# Patient Record
Sex: Female | Born: 1990 | Race: White | Hispanic: No | Marital: Married | Smoking: Former smoker
Health system: Southern US, Community
[De-identification: ages and names within clinical notes are randomized; demographics above are authoritative.]

## PROBLEM LIST (undated history)

## (undated) DIAGNOSIS — J45909 Unspecified asthma, uncomplicated: Secondary | ICD-10-CM

## (undated) DIAGNOSIS — Z3490 Encounter for supervision of normal pregnancy, unspecified, unspecified trimester: Secondary | ICD-10-CM

## (undated) HISTORY — PX: FACIAL RECONSTRUCTION SURGERY: SHX631

---

## 2014-07-02 ENCOUNTER — Emergency Department (HOSPITAL_COMMUNITY)
Admission: EM | Admit: 2014-07-02 | Discharge: 2014-07-02 | Disposition: A | Discharge status: Home / Self Care | Payer: Self-pay | Attending: Emergency Medicine | Admitting: Emergency Medicine

## 2014-07-02 ENCOUNTER — Encounter (HOSPITAL_COMMUNITY): Payer: Self-pay | Admitting: Emergency Medicine

## 2014-07-02 ENCOUNTER — Emergency Department (HOSPITAL_COMMUNITY): Payer: Self-pay

## 2014-07-02 DIAGNOSIS — Z3490 Encounter for supervision of normal pregnancy, unspecified, unspecified trimester: Secondary | ICD-10-CM

## 2014-07-02 DIAGNOSIS — Z880 Allergy status to penicillin: Secondary | ICD-10-CM | POA: Insufficient documentation

## 2014-07-02 DIAGNOSIS — R1013 Epigastric pain: Secondary | ICD-10-CM | POA: Insufficient documentation

## 2014-07-02 DIAGNOSIS — R079 Chest pain, unspecified: Secondary | ICD-10-CM

## 2014-07-02 DIAGNOSIS — Z79899 Other long term (current) drug therapy: Secondary | ICD-10-CM | POA: Insufficient documentation

## 2014-07-02 DIAGNOSIS — O99512 Diseases of the respiratory system complicating pregnancy, second trimester: Secondary | ICD-10-CM | POA: Insufficient documentation

## 2014-07-02 DIAGNOSIS — Z3A20 20 weeks gestation of pregnancy: Secondary | ICD-10-CM | POA: Insufficient documentation

## 2014-07-02 DIAGNOSIS — R109 Unspecified abdominal pain: Secondary | ICD-10-CM

## 2014-07-02 DIAGNOSIS — J45909 Unspecified asthma, uncomplicated: Secondary | ICD-10-CM | POA: Insufficient documentation

## 2014-07-02 DIAGNOSIS — O9989 Other specified diseases and conditions complicating pregnancy, childbirth and the puerperium: Secondary | ICD-10-CM | POA: Insufficient documentation

## 2014-07-02 HISTORY — DX: Unspecified asthma, uncomplicated: J45.909

## 2014-07-02 LAB — COMPREHENSIVE METABOLIC PANEL
ALBUMIN: 3.2 g/dL — AB (ref 3.5–5.0)
ALK PHOS: 46 U/L (ref 38–126)
ALT: 12 U/L — ABNORMAL LOW (ref 14–54)
AST: 17 U/L (ref 15–41)
Anion gap: 8 (ref 5–15)
BILIRUBIN TOTAL: 0.3 mg/dL (ref 0.3–1.2)
BUN: 6 mg/dL (ref 6–20)
CO2: 20 mmol/L — ABNORMAL LOW (ref 22–32)
Calcium: 8.8 mg/dL — ABNORMAL LOW (ref 8.9–10.3)
Chloride: 108 mmol/L (ref 101–111)
Creatinine, Ser: 0.43 mg/dL — ABNORMAL LOW (ref 0.44–1.00)
GFR calc Af Amer: >60 mL/min (ref >60)
GFR calc non Af Amer: >60 mL/min (ref >60)
Glucose, Bld: 91 mg/dL (ref 65–99)
Potassium: 3.4 mmol/L — ABNORMAL LOW (ref 3.5–5.1)
Sodium: 136 mmol/L (ref 135–145)
TOTAL PROTEIN: 7.2 g/dL (ref 6.5–8.1)

## 2014-07-02 LAB — CBC WITH DIFFERENTIAL/PLATELET
BASOS ABS: 0.1 10*3/uL (ref 0.0–0.1)
Basophils Relative: 0 % (ref 0–1)
Eosinophils Absolute: 0.5 10*3/uL (ref 0.0–0.7)
Eosinophils Relative: 4 % (ref 0–5)
HEMATOCRIT: 31.2 % — AB (ref 36.0–46.0)
HEMOGLOBIN: 10.3 g/dL — AB (ref 12.0–15.0)
LYMPHS ABS: 2.8 10*3/uL (ref 0.7–4.0)
LYMPHS PCT: 22 % (ref 12–46)
MCH: 27.5 pg (ref 26.0–34.0)
MCHC: 33 g/dL (ref 30.0–36.0)
MCV: 83.4 fL (ref 78.0–100.0)
MONO ABS: 0.7 10*3/uL (ref 0.1–1.0)
Monocytes Relative: 6 % (ref 3–12)
NEUTROS PCT: 68 % (ref 43–77)
Neutro Abs: 8.4 10*3/uL — ABNORMAL HIGH (ref 1.7–7.7)
Platelets: 401 10*3/uL — ABNORMAL HIGH (ref 150–400)
RBC: 3.74 MIL/uL — ABNORMAL LOW (ref 3.87–5.11)
RDW: 13.8 % (ref 11.5–15.5)
WBC: 12.5 10*3/uL — AB (ref 4.0–10.5)

## 2014-07-02 LAB — URINE MICROSCOPIC-ADD ON

## 2014-07-02 LAB — URINALYSIS, ROUTINE W REFLEX MICROSCOPIC
BILIRUBIN URINE: NEGATIVE
GLUCOSE, UA: NEGATIVE mg/dL
Hgb urine dipstick: NEGATIVE
Ketones, ur: NEGATIVE mg/dL
NITRITE: NEGATIVE
PH: 6 (ref 5.0–8.0)
Protein, ur: NEGATIVE mg/dL
Specific Gravity, Urine: 1.025 (ref 1.005–1.030)
Urobilinogen, UA: 0.2 mg/dL (ref 0.0–1.0)

## 2014-07-02 LAB — LIPASE, BLOOD: Lipase: 26 U/L (ref 22–51)

## 2014-07-02 MED ORDER — ONDANSETRON HCL 4 MG/2ML IJ SOLN
4.0000 mg | Freq: Once | INTRAMUSCULAR | Status: AC
  Administered 2014-07-02: 4 mg via INTRAVENOUS
  Filled 2014-07-02: qty 2

## 2014-07-02 MED ORDER — SODIUM CHLORIDE 0.9 % IV SOLN
1000.0000 mL | Freq: Once | INTRAVENOUS | Status: AC
Start: 2014-07-02 — End: 2014-07-02
  Administered 2014-07-02: 1000 mL via INTRAVENOUS

## 2014-07-02 MED ORDER — SODIUM CHLORIDE 0.9 % IV SOLN
1000.0000 mL | INTRAVENOUS | Status: DC
Start: 2014-07-02 — End: 2014-07-03
  Administered 2014-07-02: 2000 mL via INTRAVENOUS

## 2014-07-02 MED ORDER — FAMOTIDINE 20 MG PO TABS: 20.0000 mg | ORAL_TABLET | Freq: Two times a day (BID) | ORAL | Status: AC

## 2014-07-02 MED ORDER — ALUM & MAG HYDROXIDE-SIMETH 200-200-20 MG/5ML PO SUSP
30.0000 mL | Freq: Once | ORAL | Status: AC
  Administered 2014-07-02: 30 mL via ORAL
  Filled 2014-07-02: qty 30

## 2014-07-02 NOTE — ED Notes (Signed)
Pt alert & oriented x4, stable gait. Patient given discharge instructions, paperwork & prescription(s). Patient  instructed to stop at the registration desk to finish any additional paperwork. Patient verbalized understanding. Pt left department w/ no further questions. 

## 2014-07-02 NOTE — ED Provider Notes (Signed)
CSN: [id number]     Arrival date & time 07/02/14  1951 History   First MD Initiated Contact with Patient 07/02/14 1958     Chief Complaint  Patient presents with  . Abdominal Pain   HPI Patient is 20 weeks estimated gestational age. Since this evening about an hour or so ago she had sudden onset of pain in her upper abdomen that moved up towards her chest. The pressure type sensation and had some dyspnea. She denies any trouble with nausea or vomiting. No dysuria. No diarrhea or constipation. She had eaten something about 2-3 hours before. She did not have any pain or discomfort with that. Patient denies any previous history of similar symptoms. Past Medical History  Diagnosis Date  . Asthma    Past Surgical History  Procedure Laterality Date  . Facial reconstruction surgery     No family history on file. History  Substance Use Topics  . Smoking status: Never Smoker   . Smokeless tobacco: Not on file  . Alcohol Use: No   OB History    Gravida Para Term Preterm AB TAB SAB Ectopic Multiple Living   1              Review of Systems  All other systems reviewed and are negative.     Allergies  Amoxicillin and Penicillins  Home Medications   Prior to Admission medications   Medication Sig Start Date End Date Taking? Authorizing Provider  famotidine (PEPCID) 20 MG tablet Take 1 tablet (20 mg total) by mouth 2 (two) times daily. 07/02/14   Linwood DibblesJon Liya Strollo, MD  ibuprofen (ADVIL,MOTRIN) 200 MG tablet Take 800 mg by mouth every 6 (six) hours as needed for mild pain or moderate pain.   Yes Historical Provider, MD  nitrofurantoin, macrocrystal-monohydrate, (MACROBID) 100 MG capsule Take 100 mg by mouth 2 (two) times daily. 06/20/14   Historical Provider, MD   BP 109/64 mmHg  Pulse 86  Temp(Src) 97.3 F (36.3 C) (Oral)  Resp 20  SpO2 99%  LMP  Physical Exam  Constitutional: She appears well-developed and well-nourished. No distress.  HENT:  Head: Normocephalic and atraumatic.  Right  Ear: External ear normal.  Left Ear: External ear normal.  Eyes: Conjunctivae are normal. Right eye exhibits no discharge. Left eye exhibits no discharge. No scleral icterus.  Neck: Neck supple. No tracheal deviation present.  Cardiovascular: Normal rate, regular rhythm and intact distal pulses.   Pulmonary/Chest: Effort normal and breath sounds normal. No stridor. No respiratory distress. She has no wheezes. She has no rales.  Abdominal: Soft. Bowel sounds are normal. She exhibits no distension. There is tenderness in the epigastric area. There is no rebound and no guarding. No hernia.  Musculoskeletal: She exhibits no edema or tenderness.  Neurological: She is alert. She has normal strength. No cranial nerve deficit (no facial droop, extraocular movements intact, no slurred speech) or sensory deficit. She exhibits normal muscle tone. She displays no seizure activity. Coordination normal.  Skin: Skin is warm and dry. No rash noted.  Psychiatric: She has a normal mood and affect.  Nursing note and vitals reviewed.   ED Course  Procedures (including critical care time) Labs Review Labs Reviewed  CBC WITH DIFFERENTIAL/PLATELET - Abnormal; Notable for the following:    WBC 12.5 (*)    RBC 3.74 (*)    Hemoglobin 10.3 (*)    HCT 31.2 (*)    Platelets 401 (*)    Neutro Abs 8.4 (*)  All other components within normal limits  COMPREHENSIVE METABOLIC PANEL - Abnormal; Notable for the following:    Potassium 3.4 (*)    CO2 20 (*)    Creatinine, Ser 0.43 (*)    Calcium 8.8 (*)    Albumin 3.2 (*)    ALT 12 (*)    All other components within normal limits  URINALYSIS, ROUTINE W REFLEX MICROSCOPIC - Abnormal; Notable for the following:    APPearance HAZY (*)    Leukocytes, UA SMALL (*)    All other components within normal limits  URINE MICROSCOPIC-ADD ON - Abnormal; Notable for the following:    Squamous Epithelial / LPF MANY (*)    Bacteria, UA MANY (*)    All other components within  normal limits  LIPASE, BLOOD    Imaging Review Dg Chest 1 View  07/02/2014   CLINICAL DATA:  Acute onset of lower chest pain and upper abdominal pain. Initial encounter.  EXAM: CHEST  1 VIEW  COMPARISON:  None.  FINDINGS: The lungs are well-aerated and clear. There is no evidence of focal opacification, pleural effusion or pneumothorax.  The cardiomediastinal silhouette is within normal limits. No acute osseous abnormalities are seen.  IMPRESSION: No acute cardiopulmonary process seen.   Electronically Signed   By: Roanna RaiderJeffery  Chang M.D.   On: 07/02/2014 22:08     EKG Interpretation   Date/Time:  Sunday Jul 02 2014 20:19:50 EDT Ventricular Rate:  84 PR Interval:  131 QRS Duration: 104 QT Interval:  371 QTC Calculation: 438 R Axis:   60 Text Interpretation:  Sinus rhythm No old tracing to compare Confirmed by  Zakaiya Lares  MD-J, Vadis Slabach ([id number]) on 07/02/2014 10:17:49 PM      MDM   Final diagnoses:  Pregnancy  Abdominal pain, unspecified abdominal location    Spector symptoms may have been related to esophagitis. The symptoms have improved a GI cocktail. Since symptoms have resolved. She is not tachycardic she is not dyspneic. I doubt pulmonary embolism.  Patient is not having any complaints of vaginal bleeding or normal fetal heart tones were obtained during her stay.  At this time there does not appear to be any evidence of an acute emergency medical condition and the patient appears stable for discharge with appropriate outpatient follow up.     Linwood DibblesJon Marcelline Temkin, MD 07/02/14 [id number]

## 2014-07-02 NOTE — Discharge Instructions (Signed)

## 2014-07-02 NOTE — Progress Notes (Signed)
Call with pt presenting at 20weeks with abd pain. [place]. RN reposrt doppler FHR 140 and on monitor at 135-140. Recommend to leave toco on and doppler FHR is sufficient.

## 2014-07-02 NOTE — ED Notes (Signed)
Pt. Reports she is [redacted] weeks pregnant pt. Reports abdominal pain starting just prior to arrival. Pt. Denies nausea/vomiting.

## 2014-09-30 ENCOUNTER — Encounter (HOSPITAL_COMMUNITY): Payer: Self-pay

## 2014-09-30 ENCOUNTER — Emergency Department (HOSPITAL_COMMUNITY)
Admission: EM | Admit: 2014-09-30 | Discharge: 2014-09-30 | Disposition: A | Discharge status: Home / Self Care | Payer: BLUE CROSS/BLUE SHIELD | Attending: Emergency Medicine | Admitting: Emergency Medicine

## 2014-09-30 DIAGNOSIS — R1011 Right upper quadrant pain: Secondary | ICD-10-CM | POA: Diagnosis not present

## 2014-09-30 DIAGNOSIS — Z3A32 32 weeks gestation of pregnancy: Secondary | ICD-10-CM | POA: Diagnosis not present

## 2014-09-30 DIAGNOSIS — Z3493 Encounter for supervision of normal pregnancy, unspecified, third trimester: Secondary | ICD-10-CM

## 2014-09-30 DIAGNOSIS — O9989 Other specified diseases and conditions complicating pregnancy, childbirth and the puerperium: Secondary | ICD-10-CM | POA: Diagnosis not present

## 2014-09-30 DIAGNOSIS — M549 Dorsalgia, unspecified: Secondary | ICD-10-CM | POA: Diagnosis not present

## 2014-09-30 DIAGNOSIS — R140 Abdominal distension (gaseous): Secondary | ICD-10-CM | POA: Diagnosis not present

## 2014-09-30 DIAGNOSIS — Z880 Allergy status to penicillin: Secondary | ICD-10-CM | POA: Diagnosis not present

## 2014-09-30 DIAGNOSIS — R1033 Periumbilical pain: Secondary | ICD-10-CM | POA: Diagnosis not present

## 2014-09-30 DIAGNOSIS — O99513 Diseases of the respiratory system complicating pregnancy, third trimester: Secondary | ICD-10-CM | POA: Diagnosis not present

## 2014-09-30 DIAGNOSIS — J45909 Unspecified asthma, uncomplicated: Secondary | ICD-10-CM | POA: Insufficient documentation

## 2014-09-30 DIAGNOSIS — Z79899 Other long term (current) drug therapy: Secondary | ICD-10-CM | POA: Diagnosis not present

## 2014-09-30 HISTORY — DX: Encounter for supervision of normal pregnancy, unspecified, unspecified trimester: Z34.90

## 2014-09-30 LAB — URINALYSIS, ROUTINE W REFLEX MICROSCOPIC
BILIRUBIN URINE: NEGATIVE
Bilirubin Urine: NEGATIVE
GLUCOSE, UA: NEGATIVE mg/dL
Glucose, UA: NEGATIVE mg/dL
HGB URINE DIPSTICK: NEGATIVE
Hgb urine dipstick: NEGATIVE
KETONES UR: NEGATIVE mg/dL
Ketones, ur: NEGATIVE mg/dL
LEUKOCYTES UA: NEGATIVE
NITRITE: NEGATIVE
Nitrite: NEGATIVE
PH: 6.5 (ref 5.0–8.0)
PROTEIN: NEGATIVE mg/dL
Protein, ur: NEGATIVE mg/dL
SPECIFIC GRAVITY, URINE: 1.01 (ref 1.005–1.030)
Urobilinogen, UA: 0.2 mg/dL (ref 0.0–1.0)
Urobilinogen, UA: 0.2 mg/dL (ref 0.0–1.0)
pH: 7 (ref 5.0–8.0)

## 2014-09-30 LAB — URINE MICROSCOPIC-ADD ON

## 2014-09-30 MED ORDER — SODIUM CHLORIDE 0.9 % IV BOLUS (SEPSIS)
1000.0000 mL | Freq: Once | INTRAVENOUS | Status: AC
Start: 2014-09-30 — End: 2014-09-30
  Administered 2014-09-30: 1000 mL via INTRAVENOUS

## 2014-09-30 MED ORDER — ACETAMINOPHEN 325 MG PO TABS: 650.0000 mg | ORAL_TABLET | Freq: Four times a day (QID) | ORAL | Status: AC | PRN

## 2014-09-30 MED ORDER — ACETAMINOPHEN 325 MG PO TABS
650.0000 mg | ORAL_TABLET | Freq: Once | ORAL | Status: AC
  Administered 2014-09-30: 650 mg via ORAL
  Filled 2014-09-30: qty 2

## 2014-09-30 NOTE — ED Notes (Signed)
Pt denies any discharge, vomiting, or diarrhea.  States that she has had this pain before and was told by OBGYN that she was having stretching pains from the growth of the baby.  This pain started Thursday and is not getting better

## 2014-09-30 NOTE — ED Notes (Addendum)
[facility] called, no contractions noted, baby does not appear to be in distress

## 2014-09-30 NOTE — Progress Notes (Addendum)
Spoke with Statistician. Dr. Despina Hidden wants the pt to get 1GM of IM Roecephin in addition to the oral antibiotics she is to be d/c with. If the ED MD has any questions, he can call Dr. Despina Hidden at [phone].

## 2014-09-30 NOTE — Progress Notes (Signed)
Updated [facility] RN on pt status; intermitent tracing 135bpm, uterine irritability w/occ UC

## 2014-09-30 NOTE — ED Provider Notes (Signed)
CSN: [id number]     Arrival date & time 09/30/14  4098 History   First MD Initiated Contact with Patient 09/30/14 0557     Chief Complaint  Patient presents with  . Abdominal Pain     (Consider location/radiation/quality/duration/timing/severity/associated sxs/prior Treatment) Patient is a 24 y.o. female presenting with abdominal pain. The history is provided by the patient.  Abdominal Pain Pain location:  Periumbilical Pain quality: aching and dull   Pain radiates to:  Does not radiate Pain severity:  Moderate Onset quality:  Gradual Duration:  36 hours Timing:  Constant Progression:  Unchanged Chronicity:  New Context: not recent sexual activity and not trauma   Relieved by:  None tried Ineffective treatments:  None tried Associated symptoms: nausea   Associated symptoms: no constipation, no diarrhea, no dysuria, no fever, no hematuria, no melena, no vaginal bleeding, no vaginal discharge and no vomiting     Past Medical History  Diagnosis Date  . Asthma   . Pregnant    Past Surgical History  Procedure Laterality Date  . Facial reconstruction surgery     No family history on file. Social History  Substance Use Topics  . Smoking status: Never Smoker   . Smokeless tobacco: None  . Alcohol Use: No   OB History    Gravida Para Term Preterm AB TAB SAB Ectopic Multiple Living   1              Review of Systems  Constitutional: Negative for fever.  Gastrointestinal: Positive for nausea and abdominal pain. Negative for vomiting, diarrhea, constipation and melena.  Genitourinary: Negative for dysuria, hematuria, vaginal bleeding and vaginal discharge.      Allergies  Amoxicillin and Penicillins  Home Medications   Prior to Admission medications   Medication Sig Start Date End Date Taking? Authorizing Provider  famotidine (PEPCID) 20 MG tablet Take 1 tablet (20 mg total) by mouth 2 (two) times daily. 07/02/14   Linwood Dibbles, MD  ibuprofen (ADVIL,MOTRIN) 200 MG  tablet Take 800 mg by mouth every 6 (six) hours as needed for mild pain or moderate pain.    Historical Provider, MD  nitrofurantoin, macrocrystal-monohydrate, (MACROBID) 100 MG capsule Take 100 mg by mouth 2 (two) times daily. 06/20/14   Historical Provider, MD   BP 120/64 mmHg  Pulse 85  Temp(Src) 97.9 F (36.6 C) (Oral)  Resp 18  Ht 5\' 4"  (1.626 m)  Wt 232 lb (105.235 kg)  BMI 39.80 kg/m2  SpO2 100%  LMP 01/11/2014 Physical Exam  Constitutional: She is oriented to person, place, and time. She appears well-developed and well-nourished.  HENT:  Head: Normocephalic and atraumatic.  Eyes: EOM are normal. Pupils are equal, round, and reactive to light.  Neck: Neck supple.  Cardiovascular: Normal rate, regular rhythm and normal heart sounds.   No murmur heard. Pulmonary/Chest: Effort normal. No respiratory distress.  Abdominal: Soft. She exhibits distension. There is tenderness. There is no rebound and no guarding.  Periumbilical tenderness, no RLQ, RUQ of flank tenderness.  Neurological: She is alert and oriented to person, place, and time.  Skin: Skin is warm and dry.  Nursing note and vitals reviewed.   ED Course  Procedures (including critical care time) Labs Review Labs Reviewed  URINE CULTURE  URINALYSIS, ROUTINE W REFLEX MICROSCOPIC (NOT AT [facility])      EKG Interpretation None      MDM   Final diagnoses:  Third trimester pregnancy  Periumbilical abdominal pain  Back pain in pregnancy  Pt comes in with abdominal pain. Pain is periumbilical and she has back pain. Pain is moderate, constant - the latter got patient concerned. Fetal heart tone is reassuring, there is no contractions on the monitor. Will hydrate, check UA. Pt has no pelvic pain, vaginal discharge, bleeding - which is reassuring.   Derwood Kaplan, MD 09/30/14 [phone]

## 2014-09-30 NOTE — ED Notes (Signed)
Pt reports she is approx [redacted] weeks pregnant, having generalized abd and back pain x 2 days.  Pt states the pain is fairly constant but gets worse at times.  Pt denies vaginal bleeding or discharge.

## 2014-09-30 NOTE — Progress Notes (Signed)
Spoke with Statistician. Pt does not have a UTI. She will be d/c with Tylenol and instructions to stay hydrated.

## 2014-09-30 NOTE — Discharge Instructions (Signed)

## 2014-09-30 NOTE — Progress Notes (Signed)
Spoke with Dr. Despina Hidden. Pt is a G1P0 at 32 5/[redacted] weeks gestation with c/o abd pain. Pt is from out of town.She has had  IV fluids and a cath UA has been sent down. I was told by Crystal RN that the pt has an UTI. Dr. Despina Hidden wants the pt to have 1GM of Roecephin IM in addition to the oral antibiotics that she will be d/c with.

## 2014-09-30 NOTE — Progress Notes (Signed)
Spoke with Dr. Despina Hidden. Pt does not have a UTI and will be d/c with Tylenol and instructions to stay hydrated. Okay with the plan of care.

## 2014-10-02 LAB — URINE CULTURE

## 2014-12-03 ENCOUNTER — Encounter (HOSPITAL_COMMUNITY): Payer: Self-pay

## 2014-12-03 ENCOUNTER — Emergency Department (HOSPITAL_COMMUNITY)
Admission: EM | Admit: 2014-12-03 | Discharge: 2014-12-04 | Disposition: A | Discharge status: Home / Self Care | Payer: BLUE CROSS/BLUE SHIELD | Attending: Emergency Medicine | Admitting: Emergency Medicine

## 2014-12-03 DIAGNOSIS — R0789 Other chest pain: Secondary | ICD-10-CM | POA: Diagnosis not present

## 2014-12-03 DIAGNOSIS — Z87891 Personal history of nicotine dependence: Secondary | ICD-10-CM | POA: Insufficient documentation

## 2014-12-03 DIAGNOSIS — R231 Pallor: Secondary | ICD-10-CM | POA: Diagnosis not present

## 2014-12-03 DIAGNOSIS — R112 Nausea with vomiting, unspecified: Secondary | ICD-10-CM | POA: Insufficient documentation

## 2014-12-03 DIAGNOSIS — Z79899 Other long term (current) drug therapy: Secondary | ICD-10-CM | POA: Diagnosis not present

## 2014-12-03 DIAGNOSIS — Z880 Allergy status to penicillin: Secondary | ICD-10-CM | POA: Diagnosis not present

## 2014-12-03 DIAGNOSIS — R079 Chest pain, unspecified: Secondary | ICD-10-CM | POA: Diagnosis present

## 2014-12-03 DIAGNOSIS — M546 Pain in thoracic spine: Secondary | ICD-10-CM | POA: Diagnosis not present

## 2014-12-03 DIAGNOSIS — J45909 Unspecified asthma, uncomplicated: Secondary | ICD-10-CM | POA: Insufficient documentation

## 2014-12-03 NOTE — ED Notes (Signed)
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 I am hurting in the center of my chest into my back, I have vomited once. 2 weeks ago I had a baby by c-section per pt.

## 2014-12-04 ENCOUNTER — Emergency Department (HOSPITAL_COMMUNITY): Payer: BLUE CROSS/BLUE SHIELD

## 2014-12-04 DIAGNOSIS — R0789 Other chest pain: Secondary | ICD-10-CM | POA: Diagnosis not present

## 2014-12-04 LAB — URINALYSIS, ROUTINE W REFLEX MICROSCOPIC
Bilirubin Urine: NEGATIVE
Glucose, UA: NEGATIVE mg/dL
Ketones, ur: NEGATIVE mg/dL
Nitrite: NEGATIVE
PH: 7.5 (ref 5.0–8.0)
Protein, ur: NEGATIVE mg/dL
SPECIFIC GRAVITY, URINE: 1.01 (ref 1.005–1.030)
UROBILINOGEN UA: 0.2 mg/dL (ref 0.0–1.0)

## 2014-12-04 LAB — COMPREHENSIVE METABOLIC PANEL
ALBUMIN: 3.8 g/dL (ref 3.5–5.0)
ALT: 13 U/L — ABNORMAL LOW (ref 14–54)
ANION GAP: 7 (ref 5–15)
AST: 20 U/L (ref 15–41)
Alkaline Phosphatase: 95 U/L (ref 38–126)
BUN: 11 mg/dL (ref 6–20)
CO2: 26 mmol/L (ref 22–32)
Calcium: 9.3 mg/dL (ref 8.9–10.3)
Chloride: 104 mmol/L (ref 101–111)
Creatinine, Ser: 0.64 mg/dL (ref 0.44–1.00)
GFR calc Af Amer: >60 mL/min (ref >60)
GFR calc non Af Amer: >60 mL/min (ref >60)
Glucose, Bld: 100 mg/dL — ABNORMAL HIGH (ref 65–99)
POTASSIUM: 4.2 mmol/L (ref 3.5–5.1)
SODIUM: 137 mmol/L (ref 135–145)
Total Bilirubin: 0.4 mg/dL (ref 0.3–1.2)
Total Protein: 8 g/dL (ref 6.5–8.1)

## 2014-12-04 LAB — CBC WITH DIFFERENTIAL/PLATELET
Basophils Absolute: 0.1 K/uL (ref 0.0–0.1)
Basophils Relative: 0 %
Eosinophils Absolute: 0.1 K/uL (ref 0.0–0.7)
Eosinophils Relative: 1 %
HCT: 31.2 % — ABNORMAL LOW (ref 36.0–46.0)
Hemoglobin: 9.8 g/dL — ABNORMAL LOW (ref 12.0–15.0)
Lymphocytes Relative: 10 %
Lymphs Abs: 1.4 K/uL (ref 0.7–4.0)
MCH: 24.4 pg — ABNORMAL LOW (ref 26.0–34.0)
MCHC: 31.4 g/dL (ref 30.0–36.0)
MCV: 77.8 fL — ABNORMAL LOW (ref 78.0–100.0)
Monocytes Absolute: 0.6 K/uL (ref 0.1–1.0)
Monocytes Relative: 4 %
Neutro Abs: 12.4 K/uL — ABNORMAL HIGH (ref 1.7–7.7)
Neutrophils Relative %: 85 %
Platelets: 680 K/uL — ABNORMAL HIGH (ref 150–400)
RBC: 4.01 MIL/uL (ref 3.87–5.11)
RDW: 14.3 % (ref 11.5–15.5)
WBC: 14.5 K/uL — ABNORMAL HIGH (ref 4.0–10.5)

## 2014-12-04 LAB — URINE MICROSCOPIC-ADD ON

## 2014-12-04 LAB — LIPASE, BLOOD: Lipase: 32 U/L (ref 22–51)

## 2014-12-04 LAB — TROPONIN I

## 2014-12-04 MED ORDER — FAMOTIDINE IN NACL 20-0.9 MG/50ML-% IV SOLN
20.0000 mg | Freq: Once | INTRAVENOUS | Status: AC
  Administered 2014-12-04: 20 mg via INTRAVENOUS
  Filled 2014-12-04: qty 50

## 2014-12-04 MED ORDER — SODIUM CHLORIDE 0.9 % IV BOLUS (SEPSIS)
1000.0000 mL | Freq: Once | INTRAVENOUS | Status: AC
  Administered 2014-12-04: 1000 mL via INTRAVENOUS

## 2014-12-04 MED ORDER — OMEPRAZOLE 20 MG PO CPDR: DELAYED_RELEASE_CAPSULE | ORAL | Status: AC

## 2014-12-04 MED ORDER — GI COCKTAIL ~~LOC~~
30.0000 mL | Freq: Once | ORAL | Status: DC
  Filled 2014-12-04: qty 30

## 2014-12-04 NOTE — Discharge Instructions (Signed)
Take the medication as directed. Let your OB know about the painful episodes at your next visit. Recheck if you get worse again and the pain isn't relieved by TUMS, maalox or mylanta.

## 2014-12-04 NOTE — ED Provider Notes (Addendum)
CSN: [id number]     Arrival date & time 12/03/14  2309 History   First MD Initiated Contact with Patient 12/04/14 0111   Chief Complaint  Patient presents with  . Back Pain     (Consider location/radiation/quality/duration/timing/severity/associated sxs/prior Treatment) HPI patient reports she delivered on September 28. She states 8:30 PM she started having pain in her lower chest and upper back. She describes it at first this pressure and then later on as a aching and burning pain. She had nausea and vomited once without blood. She states nothing she does makes the pain hurt more, nothing she does makes it feel better. She denies any feeling of acid running up her chest into her throat. She states she had this discomfort before however it went away without seeking medical attention. She states tonight is worse than what she had before. She did not eat lunch or dinner today.   PCP none OB Dr Leontine Locket in [place]  Past Medical History  Diagnosis Date  . Asthma   . Pregnant    Past Surgical History  Procedure Laterality Date  . Facial reconstruction surgery     No family history on file. Social History  Substance Use Topics  . Smoking status: Former Games developer  . Smokeless tobacco: None  . Alcohol Use: No   Will start a part time job in November Smokes 1 cig every few days, stopped while pregnant  OB History    Gravida Para Term Preterm AB TAB SAB Ectopic Multiple Living   1 0             Review of Systems  All other systems reviewed and are negative.     Allergies  Amoxicillin and Penicillins  Home Medications   Prior to Admission medications   Medication Sig Start Date End Date Taking? Authorizing Provider  acetaminophen (TYLENOL) 325 MG tablet Take 2 tablets (650 mg total) by mouth every 6 (six) hours as needed. 09/30/14   Derwood Kaplan, MD  famotidine (PEPCID) 20 MG tablet Take 1 tablet (20 mg total) by mouth 2 (two) times daily. 07/02/14   Linwood Dibbles, MD    ibuprofen (ADVIL,MOTRIN) 200 MG tablet Take 800 mg by mouth every 6 (six) hours as needed for mild pain or moderate pain.    Historical Provider, MD  nitrofurantoin, macrocrystal-monohydrate, (MACROBID) 100 MG capsule Take 100 mg by mouth 2 (two) times daily. 06/20/14   Historical Provider, MD  omeprazole (PRILOSEC) 20 MG capsule Take 1 po BID x 2 weeks then once a day 12/04/14   Devoria Albe, MD   BP 107/66 mmHg  Pulse 72  Temp(Src) 98.2 F (36.8 C) (Oral)  Resp 20  Ht  (1.626 m)  Wt 223 lb (101.152 kg)  BMI 38.26 kg/m2  SpO2 99%  LMP 01/11/2014  Breastfeeding? No  Vital signs normal   Physical Exam  Constitutional: She is oriented to person, place, and time. She appears well-developed and well-nourished.  Non-toxic appearance. She does not appear ill. No distress.  HENT:  Head: Normocephalic and atraumatic.  Right Ear: External ear normal.  Left Ear: External ear normal.  Nose: Nose normal. No mucosal edema or rhinorrhea.  Mouth/Throat: Oropharynx is clear and moist and mucous membranes are normal. No dental abscesses or uvula swelling.  Eyes: Conjunctivae and EOM are normal. Pupils are equal, round, and reactive to light.  Neck: Normal range of motion and full passive range of motion without pain. Neck supple.  Cardiovascular: Normal rate, regular rhythm  and normal heart sounds.  Exam reveals no gallop and no friction rub.   No murmur heard. Pulmonary/Chest: Effort normal and breath sounds normal. No respiratory distress. She has no wheezes. She has no rhonchi. She has no rales.   She exhibits no tenderness and no crepitus.    Area of chest and back pain noted, however she is nontender to palpation.  Abdominal: Soft. Normal appearance and bowel sounds are normal. She exhibits no distension. There is no tenderness. There is no rebound and no guarding.  Musculoskeletal: Normal range of motion. She exhibits no edema or tenderness.  Moves all extremities well.   Neurological:  She is alert and oriented to person, place, and time. She has normal strength. No cranial nerve deficit.  Skin: Skin is warm, dry and intact. No rash noted. No erythema. There is pallor.  Psychiatric: She has a normal mood and affect. Her speech is normal and behavior is normal. Her mood appears not anxious.  Nursing note and vitals reviewed.   ED Course  Procedures (including critical care time)  Medications  gi cocktail (Maalox,Lidocaine,Donnatal) (30 mLs Oral Not Given 12/04/14 0147)  sodium chloride 0.9 % bolus 1,000 mL (1,000 mLs Intravenous New Bag/Given 12/04/14 0153)  famotidine (PEPCID) IVPB 20 mg premix (0 mg Intravenous Stopped 12/04/14 0219)   Recheck at 3:45 AM. Patient states her pain is gone. We went over her test results. Her pain may be related to a hiatal hernia or less likely esophageal spasm. She has an OB appointment coming up and she is advised to discuss the pain with them. She was started on a PPI for this discomfort. At this point I do not see any obvious evidence for gallstones or pancreatitis.  Labs Review Results for orders placed or performed during the hospital encounter of 12/03/14  Comprehensive metabolic panel  Result Value Ref Range   Sodium 137 135 - 145 mmol/L   Potassium 4.2 3.5 - 5.1 mmol/L   Chloride 104 101 - 111 mmol/L   CO2 26 22 - 32 mmol/L   Glucose, Bld 100 (H) 65 - 99 mg/dL   BUN 11 6 - 20 mg/dL   Creatinine, Ser 4.780.64 0.44 - 1.00 mg/dL   Calcium 9.3 8.9 - 29.510.3 mg/dL   Total Protein 8.0 6.5 - 8.1 g/dL   Albumin 3.8 3.5 - 5.0 g/dL   AST 20 15 - 41 U/L   ALT 13 (L) 14 - 54 U/L   Alkaline Phosphatase 95 38 - 126 U/L   Total Bilirubin 0.4 0.3 - 1.2 mg/dL   GFR calc non Af Amer >60 >60 mL/min   GFR calc Af Amer >60 >60 mL/min   Anion gap 7 5 - 15  Lipase, blood  Result Value Ref Range   Lipase 32 22 - 51 U/L  CBC with Differential  Result Value Ref Range   WBC 14.5 (H) 4.0 - 10.5 K/uL   RBC 4.01 3.87 - 5.11 MIL/uL   Hemoglobin 9.8  (L) 12.0 - 15.0 g/dL   HCT 62.131.2 (L) 30.836.0 - 65.746.0 %   MCV 77.8 (L) 78.0 - 100.0 fL   MCH 24.4 (L) 26.0 - 34.0 pg   MCHC 31.4 30.0 - 36.0 g/dL   RDW 84.614.3 96.211.5 - 95.215.5 %   Platelets 680 (H) 150 - 400 K/uL   Neutrophils Relative % 85 %   Neutro Abs 12.4 (H) 1.7 - 7.7 K/uL   Lymphocytes Relative 10 %   Lymphs Abs 1.4 0.7 -  4.0 K/uL   Monocytes Relative 4 %   Monocytes Absolute 0.6 0.1 - 1.0 K/uL   Eosinophils Relative 1 %   Eosinophils Absolute 0.1 0.0 - 0.7 K/uL   Basophils Relative 0 %   Basophils Absolute 0.1 0.0 - 0.1 K/uL  Urinalysis, Routine w reflex microscopic  Result Value Ref Range   Color, Urine YELLOW YELLOW   APPearance HAZY (A) CLEAR   Specific Gravity, Urine 1.010 1.005 - 1.030   pH 7.5 5.0 - 8.0   Glucose, UA NEGATIVE NEGATIVE mg/dL   Hgb urine dipstick LARGE (A) NEGATIVE   Bilirubin Urine NEGATIVE NEGATIVE   Ketones, ur NEGATIVE NEGATIVE mg/dL   Protein, ur NEGATIVE NEGATIVE mg/dL   Urobilinogen, UA 0.2 0.0 - 1.0 mg/dL   Nitrite NEGATIVE NEGATIVE   Leukocytes, UA LARGE (A) NEGATIVE  Troponin I  Result Value Ref Range   Troponin I <0.03 <0.031 ng/mL  Urine microscopic-add on  Result Value Ref Range   Squamous Epithelial / LPF FEW (A) RARE   WBC, UA 11-20 <3 WBC/hpf   RBC / HPF TOO NUMEROUS TO COUNT <3 RBC/hpf   Bacteria, UA MANY (A) RARE   Laboratory interpretation all normal except anemia (2 weeks post partum), contaminated UA (voided sample, patient is 2 weeks post partum)     Imaging Review Dg Chest 2 View  12/04/2014  CLINICAL DATA:  Acute onset of mid central chest pain, radiating to the back between the shoulders. Nausea and vomiting. Initial encounter. EXAM: CHEST  2 VIEW COMPARISON:  Chest radiograph performed 07/02/2014 FINDINGS: The lungs are well-aerated and clear. There is no evidence of focal opacification, pleural effusion or pneumothorax. The heart is normal in size; the mediastinal contour is within normal limits. No acute osseous  abnormalities are seen. IMPRESSION: No acute cardiopulmonary process seen. Electronically Signed   By: Roanna Raider M.D.   On: 12/04/2014 02:29   I have personally reviewed and evaluated these images and lab results as part of my medical decision-making.   EKG Interpretation   Date/Time:  Monday December 04 2014 02:01:47 EDT Ventricular Rate:  57 PR Interval:  132 QRS Duration: 103 QT Interval:  416 QTC Calculation: 405 R Axis:   70 Text Interpretation:  Sinus bradycardia Otherwise within normal limits  Since last tracing rate slower (02 Jul 2014) Confirmed by Jerline Linzy  MD-I, Mackenna Kamer  ([id number]) on 12/04/2014 3:20:30 AM      MDM   Final diagnoses:  Atypical chest pain    New Prescriptions   OMEPRAZOLE (PRILOSEC) 20 MG CAPSULE    Take 1 po BID x 2 weeks then once a day    Plan discharge  Devoria Albe, MD, Concha Pyo, MD 12/04/14 [id number]  Devoria Albe, MD 12/04/14 0401

## 2015-05-31 IMAGING — DX DG CHEST 1V
1 series · 1 of 1 positions shown · non-contrast
Comparison: None.

CLINICAL DATA: Acute onset of lower chest pain and upper abdominal
pain. Initial encounter.

EXAM:
CHEST  1 VIEW

[chest pa]
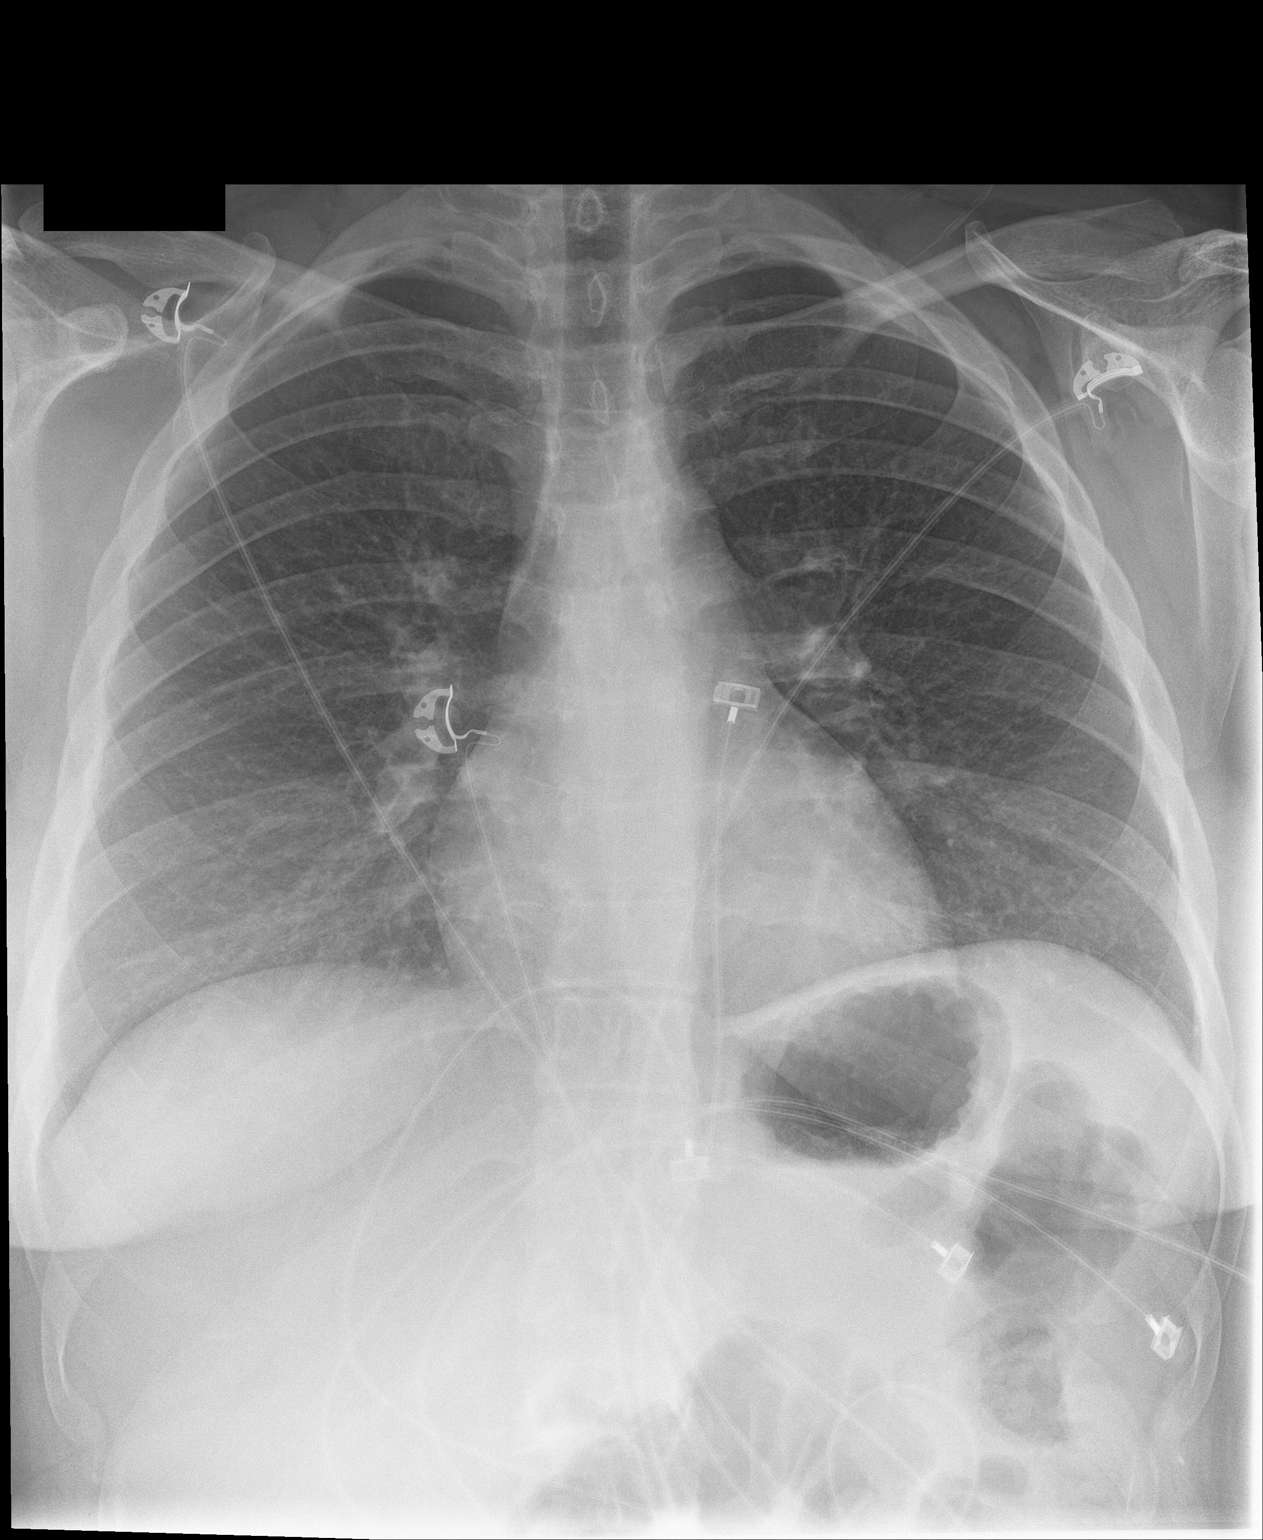

[1 of 1 positions shown; findings below may reference images not displayed]

FINDINGS: The lungs are well-aerated and clear. There is no evidence of focal
opacification, pleural effusion or pneumothorax.

The cardiomediastinal silhouette is within normal limits. No acute
osseous abnormalities are seen.
IMPRESSION: No acute cardiopulmonary process seen.

## 2015-11-02 IMAGING — DX DG CHEST 2V
2 series · 2 of 2 positions shown · non-contrast
Comparison: Chest radiograph performed 07/02/2014

CLINICAL DATA: Acute onset of mid central chest pain, radiating to
the back between the shoulders. Nausea and vomiting. Initial
encounter.

EXAM:
CHEST  2 VIEW

[chest pa]
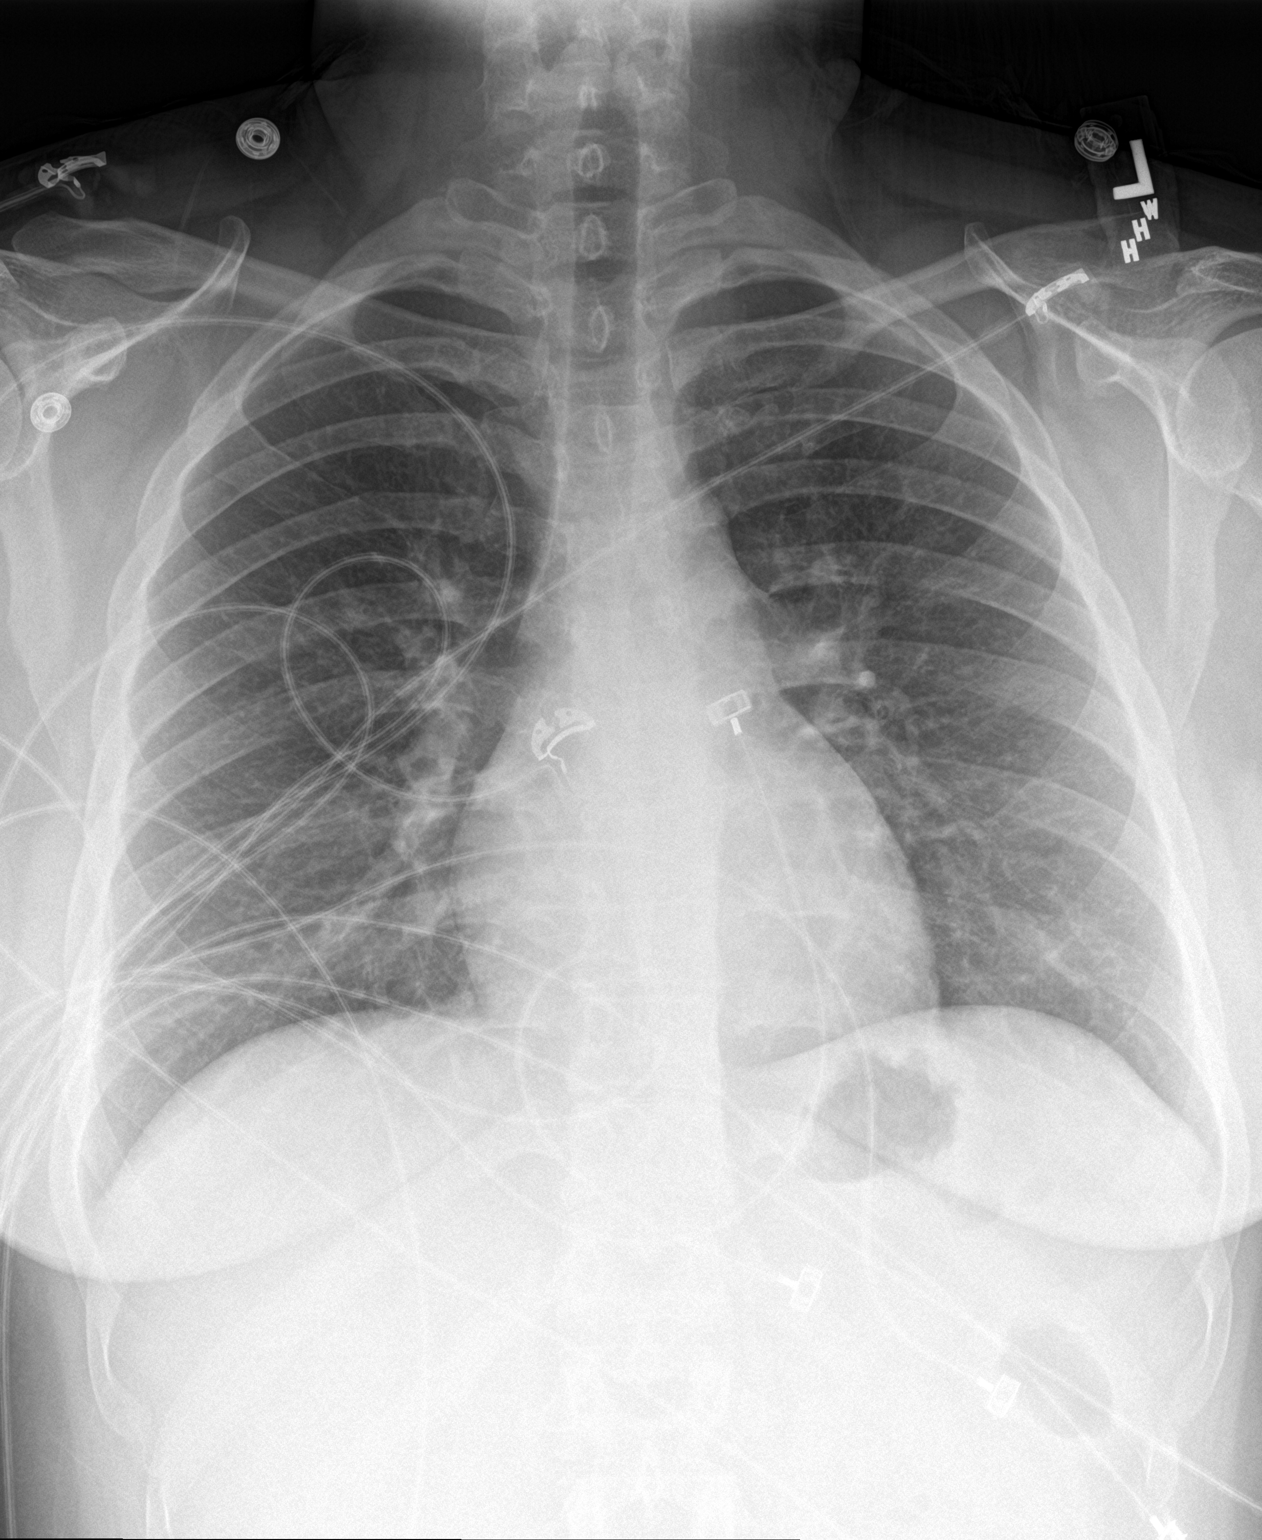

[chest lat]
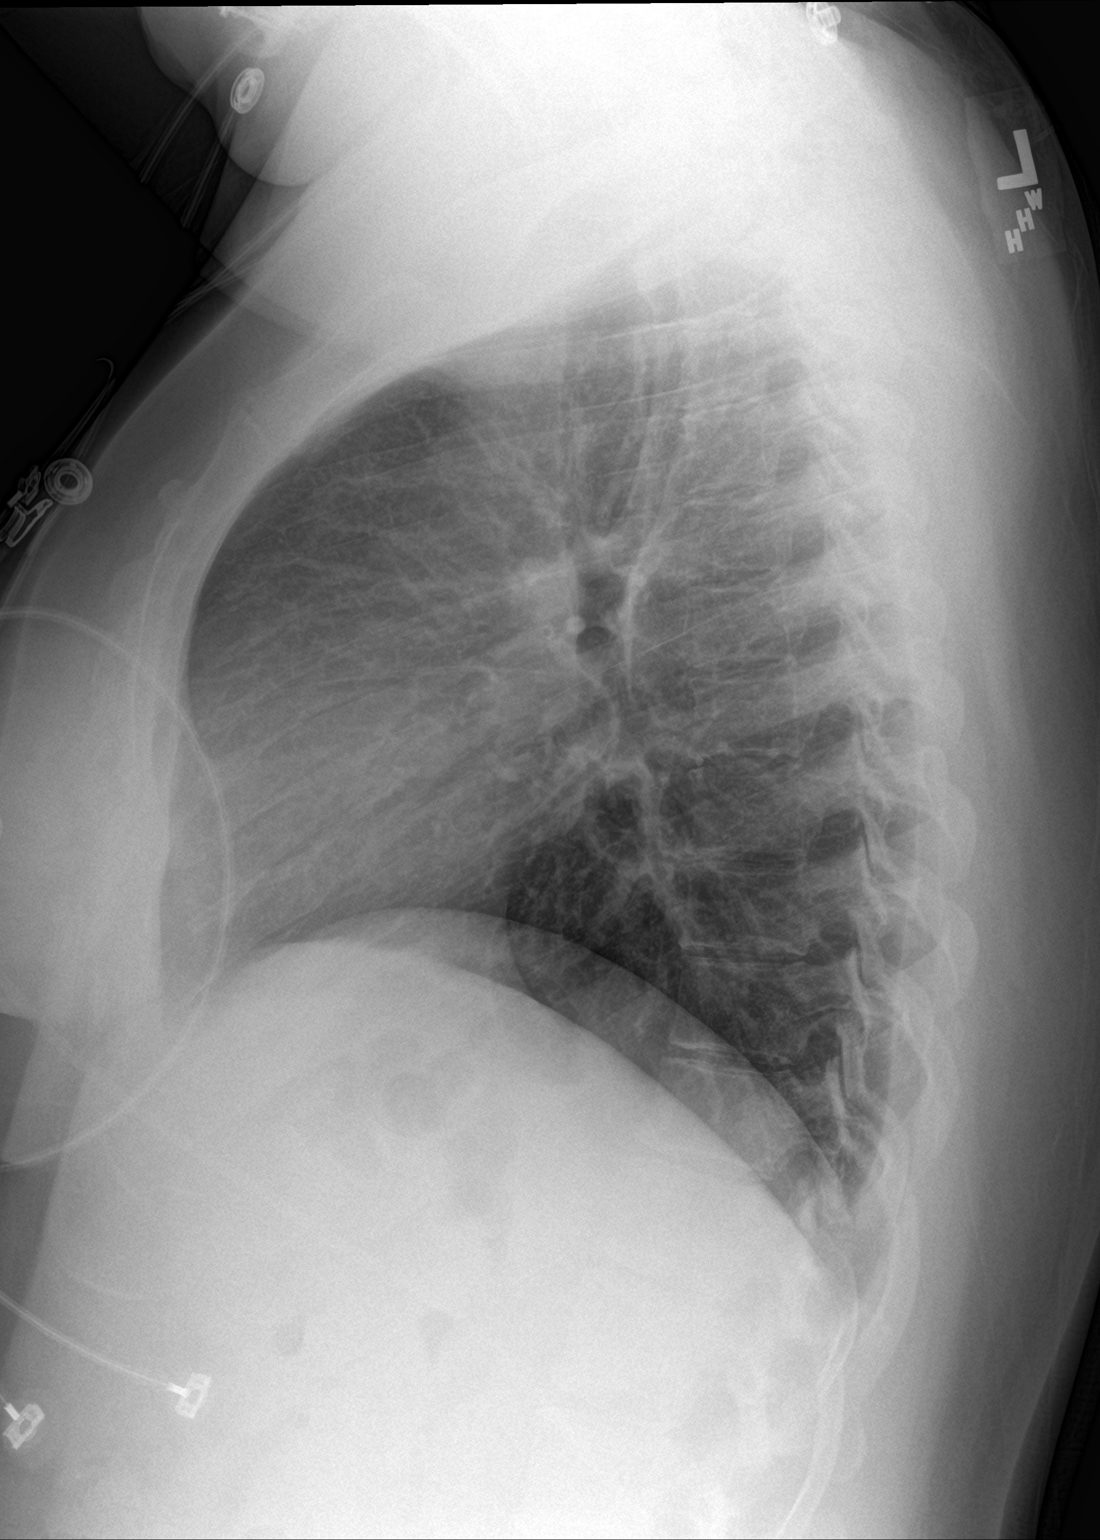

[2 of 2 positions shown; findings below may reference images not displayed]

FINDINGS: The lungs are well-aerated and clear. There is no evidence of focal
opacification, pleural effusion or pneumothorax.

The heart is normal in size; the mediastinal contour is within
normal limits. No acute osseous abnormalities are seen.
IMPRESSION: No acute cardiopulmonary process seen.
